# Patient Record
Sex: Female | Born: 1975 | Race: White | Hispanic: No | Marital: Single | Smoking: Never smoker
Health system: Southern US, Community
[De-identification: ages and names within clinical notes are randomized; demographics above are authoritative.]

## PROBLEM LIST (undated history)

## (undated) HISTORY — PX: BREAST SURGERY: SHX581

## (undated) HISTORY — PX: APPENDECTOMY: SHX54

## (undated) HISTORY — PX: ABDOMINAL HYSTERECTOMY: SHX81

## (undated) HISTORY — PX: TONSILLECTOMY: SUR1361

---

## 2017-12-01 ENCOUNTER — Encounter (HOSPITAL_BASED_OUTPATIENT_CLINIC_OR_DEPARTMENT_OTHER): Payer: Self-pay | Admitting: Emergency Medicine

## 2017-12-01 ENCOUNTER — Emergency Department (HOSPITAL_BASED_OUTPATIENT_CLINIC_OR_DEPARTMENT_OTHER): Payer: BLUE CROSS/BLUE SHIELD

## 2017-12-01 ENCOUNTER — Emergency Department (HOSPITAL_BASED_OUTPATIENT_CLINIC_OR_DEPARTMENT_OTHER)
Admission: EM | Admit: 2017-12-01 | Discharge: 2017-12-01 | Disposition: A | Discharge status: Home / Self Care | Payer: BLUE CROSS/BLUE SHIELD | Attending: Emergency Medicine | Admitting: Emergency Medicine

## 2017-12-01 ENCOUNTER — Other Ambulatory Visit: Payer: Self-pay

## 2017-12-01 DIAGNOSIS — R091 Pleurisy: Secondary | ICD-10-CM | POA: Insufficient documentation

## 2017-12-01 DIAGNOSIS — R079 Chest pain, unspecified: Secondary | ICD-10-CM | POA: Diagnosis present

## 2017-12-01 LAB — CBC
HCT: 42.8 % (ref 36.0–46.0)
HEMOGLOBIN: 13.9 g/dL (ref 12.0–15.0)
MCH: 28.8 pg (ref 26.0–34.0)
MCHC: 32.5 g/dL (ref 30.0–36.0)
MCV: 88.6 fL (ref 80.0–100.0)
NRBC: 0 % (ref 0.0–0.2)
Platelets: 229 10*3/uL (ref 150–400)
RBC: 4.83 MIL/uL (ref 3.87–5.11)
RDW: 12.9 % (ref 11.5–15.5)
WBC: 6.5 10*3/uL (ref 4.0–10.5)

## 2017-12-01 LAB — BASIC METABOLIC PANEL
ANION GAP: 8 (ref 5–15)
BUN: 11 mg/dL (ref 6–20)
CO2: 22 mmol/L (ref 22–32)
Calcium: 9.3 mg/dL (ref 8.9–10.3)
Chloride: 107 mmol/L (ref 98–111)
Creatinine, Ser: 0.64 mg/dL (ref 0.44–1.00)
GFR calc non Af Amer: >60 mL/min (ref >60)
GLUCOSE: 87 mg/dL (ref 70–99)
POTASSIUM: 4.5 mmol/L (ref 3.5–5.1)
SODIUM: 137 mmol/L (ref 135–145)

## 2017-12-01 LAB — TROPONIN I: Troponin I: <0.03 ng/mL (ref <0.03)

## 2017-12-01 LAB — PREGNANCY, URINE: PREG TEST UR: NEGATIVE

## 2017-12-01 LAB — D-DIMER, QUANTITATIVE (NOT AT ARMC): D DIMER QUANT: 0.29 ug{FEU}/mL (ref 0.00–0.50)

## 2017-12-01 MED ORDER — TRAMADOL HCL 50 MG PO TABS: ORAL_TABLET | ORAL | 0 refills | Status: AC

## 2017-12-01 MED ORDER — KETOROLAC TROMETHAMINE 30 MG/ML IJ SOLN
30.0000 mg | Freq: Once | INTRAMUSCULAR | Status: AC
  Administered 2017-12-01: 30 mg via INTRAVENOUS
  Filled 2017-12-01: qty 1

## 2017-12-01 MED ORDER — ORPHENADRINE CITRATE ER 100 MG PO TB12: 100.0000 mg | ORAL_TABLET | Freq: Two times a day (BID) | ORAL | 0 refills | Status: AC

## 2017-12-01 MED ORDER — FAMOTIDINE IN NACL 20-0.9 MG/50ML-% IV SOLN
20.0000 mg | Freq: Once | INTRAVENOUS | Status: AC
  Administered 2017-12-01: 20 mg via INTRAVENOUS
  Filled 2017-12-01: qty 50

## 2017-12-01 MED ORDER — ACETAMINOPHEN 500 MG PO TABS: 1000.0000 mg | ORAL_TABLET | Freq: Four times a day (QID) | ORAL | 0 refills | Status: AC | PRN

## 2017-12-01 MED ORDER — FAMOTIDINE 20 MG PO TABS: 20.0000 mg | ORAL_TABLET | Freq: Two times a day (BID) | ORAL | 0 refills | Status: AC

## 2017-12-01 NOTE — ED Notes (Signed)
Patient transported to X-ray 

## 2017-12-01 NOTE — ED Triage Notes (Signed)
L side chest tightness since early this morning. She states it radiates down her L arm and into her back and is worse with inspiration.

## 2017-12-01 NOTE — Discharge Instructions (Signed)
1.  Due to the possibility of gastritis or reflux contributing to your pain.  Avoid all medications called NSAIDs for the next several weeks.  This includes Voltaren, Motrin, ibuprofen, naproxen, Aleve, meloxicam.  Take Pepcid twice daily for the next 2 weeks. 2.  Take acetaminophen and Norflex (muscle relaxer) for pain for the next several days.  If pain is improving may need to decrease to as needed doses. 3.  If you need additional pain control you may add tramadol 1 to 2 tablets every 6 hours to the acetaminophen and Norflex. 4.  See a family doctor for recheck within the next 3 to 5 days.  Use the referral number in your discharge instructions if you cannot be seen by your family doctor in [place]. 5.  Return to the emergency department if you develop worsening pain, shortness of breath, lightheadedness, fever, cough or coughing up blood or other concerning symptoms.

## 2017-12-01 NOTE — ED Provider Notes (Signed)
[facility] EMERGENCY DEPARTMENT Provider Note   CSN: [id number] Arrival date & time: 12/01/17  0865     History   Chief Complaint Chief Complaint  Patient presents with  . Chest Pain    HPI Leslie Peters is a 42 y.o. female.  HPI Patient is healthy without past medical history.  She however has been taking Voltaren recently due to a twisted ankle.  She had been in [place] for 5 days on vacation.  Reports she was taking the Voltaren daily because she was walking a lot and needed for her ankle.  Patient got home yesterday.  She reports this morning at about 3 AM she awakened with left-sided chest pain.  She reports that is painful to deep breath and goes into her back.  She reports she feels pain in her arm as well.  Pain is aching in quality.  She denies recent fever, cough or lower extremity swelling.  No history of PE.  Her flight to [place] was short.  Patient does not smoke.  No birth control.  She did get a flu shot yesterday in the left shoulder. History reviewed. No pertinent past medical history.  There are no active problems to display for this patient.   Past Surgical History:  Procedure Laterality Date  . ABDOMINAL HYSTERECTOMY    . APPENDECTOMY    . BREAST SURGERY    . TONSILLECTOMY       OB History   None      Home Medications    Prior to Admission medications   Medication Sig Start Date End Date Taking? Authorizing Provider  acetaminophen (TYLENOL) 500 MG tablet Take 2 tablets (1,000 mg total) by mouth every 6 (six) hours as needed. 12/01/17   Arby Barrette, MD  famotidine (PEPCID) 20 MG tablet Take 1 tablet (20 mg total) by mouth 2 (two) times daily. 12/01/17   Arby Barrette, MD  orphenadrine (NORFLEX) 100 MG tablet Take 1 tablet (100 mg total) by mouth 2 (two) times daily. 12/01/17   Arby Barrette, MD  traMADol (ULTRAM) 50 MG tablet 1 to 2 tablets every 6 hours as needed for pain. 12/01/17   Arby Barrette, MD    Family  History No family history on file.  Social History Social History   Tobacco Use  . Smoking status: Never Smoker  . Smokeless tobacco: Never Used  Substance Use Topics  . Alcohol use: Yes    Comment: daily  . Drug use: Never     Allergies   Patient has no known allergies.   Review of Systems Review of Systems 10 Systems reviewed and are negative for acute change except as noted in the HPI.  Physical Exam Updated Vital Signs BP 116/79   Pulse 64   Temp 98.3 F (36.8 C) (Oral)   Resp 16   Ht 5\' 3"  (1.6 m)   Wt 56.7 kg   SpO2 100%   BMI 22.14 kg/m   Physical Exam  Constitutional: She is oriented to person, place, and time. She appears well-developed and well-nourished. No distress.  HENT:  Head: Normocephalic and atraumatic.  Mouth/Throat: Oropharynx is clear and moist.  Eyes: EOM are normal.  Neck: Neck supple.  Cardiovascular: Normal rate, regular rhythm, normal heart sounds and intact distal pulses.  Pulmonary/Chest: Effort normal and breath sounds normal.  Has mildly reproducible pain to the sternocostal border on the left.  No significant pain to palpation of the thoracic back or lower chest.  No rashes.  Soft tissue changes.  Abdominal: Soft. She exhibits no distension and no mass. There is no tenderness. There is no guarding.  Musculoskeletal: Normal range of motion. She exhibits no edema or tenderness.  No swelling of the upper extremities.  Left arm is normal without any soft tissue swelling or erythema.  Full range of motion.  Warm and dry.  Bilateral lower extremities calves soft and nontender.  No peripheral edema.  Feet warm and dry.  Lymphadenopathy:    She has no cervical adenopathy.  Neurological: She is alert and oriented to person, place, and time. No cranial nerve deficit. She exhibits normal muscle tone. Coordination normal.  Skin: Skin is warm and dry.  Psychiatric: She has a normal mood and affect.     ED Treatments / Results  Labs (all  labs ordered are listed, but only abnormal results are displayed) Labs Reviewed  BASIC METABOLIC PANEL  CBC  TROPONIN I  PREGNANCY, URINE  D-DIMER, QUANTITATIVE (NOT AT [facility])    EKG EKG Interpretation  Date/Time:  Saturday December 01 2017 09:55:48 EDT Ventricular Rate:  75 PR Interval:    QRS Duration: 81 QT Interval:  364 QTC Calculation: 407 R Axis:   70 Text Interpretation:  Sinus rhythm Low voltage, precordial leads normal, no old comparison Confirmed by Arby Barrette [id number]) on 12/01/2017 10:00:24 AM Also confirmed by Arby Barrette [phone]), editor Sheppard Evens ([id number])  on 12/01/2017 10:40:56 AM   Radiology Dg Chest 2 View  Result Date: 12/01/2017 CLINICAL DATA:  Anterior left sided chest pain beginning today. EXAM: CHEST - 2 VIEW COMPARISON:  None. FINDINGS: The heart size and mediastinal contours are within normal limits. Both lungs are clear. The visualized skeletal structures are unremarkable. IMPRESSION: Negative two view chest x-ray Electronically Signed   By: Marin Roberts M.D.   On: 12/01/2017 10:27    Procedures Procedures (including critical care time)  Medications Ordered in ED Medications  famotidine (PEPCID) IVPB 20 mg premix (20 mg Intravenous New Bag/Given 12/01/17 1123)  ketorolac (TORADOL) 30 MG/ML injection 30 mg (30 mg Intravenous Given 12/01/17 1122)     Initial Impression / Assessment and Plan / ED Course  I have reviewed the triage vital signs and the nursing notes.  Pertinent labs & imaging results that were available during my care of the patient were reviewed by me and considered in my medical decision making (see chart for details).    Patient presents with pain as outlined above.  Differential diagnosis includes PE, gastritis from NSAID use, musculoskeletal pain, localized muscle skeletal pain due to recent influenza shot, pericarditis.  Patient is very low risk for PE.  She does not have risk factors other than recent trip to  [place] which is a short plane ride.  She is otherwise very active and physically conditioned and does much physical activity.  D-dimer not elevated.  Low risk for PE.  Patient has been taking some increased frequency of Voltaren for a sprained ankle over the past several days.  Gastritis is a consideration.  Patient will hold NSAIDs for the next 2 weeks and take Pepcid twice daily.  Also potential for musculoskeletal pain.  Patient does workout although she reports she has been doing less so recently due to her recent vacation.  She however may have also done lifting of baggage etc. that she did not note to be a meaningful strain at that time.  Patient has had influenza shot yesterday.  She shows no signs of systemic reaction in  terms of coryzal symptoms, generalized myalgia, fever however consideration is for musculoskeletal pain associated with injection.  Patient is clinically very well in appearance.  She has no comorbid medical history.  At this time, plan will be for Pepcid twice daily for 2 weeks, acetaminophen and Norflex for the next several days and then as needed with tramadol to take if additional pain control required.  Return precautions reviewed.  Patient is instructed to follow-up with her PCP who is in [place].  She may establish a care provider here if that is more convenient for her.  Final Clinical Impressions(s) / ED Diagnoses   Final diagnoses:  Pleurisy    ED Discharge Orders         Ordered    famotidine (PEPCID) 20 MG tablet  2 times daily     12/01/17 1208    traMADol (ULTRAM) 50 MG tablet     12/01/17 1208    acetaminophen (TYLENOL) 500 MG tablet  Every 6 hours PRN     12/01/17 1208    orphenadrine (NORFLEX) 100 MG tablet  2 times daily     12/01/17 1208           Arby Barrette, MD 12/01/17 1219

## 2020-03-06 IMAGING — CR DG CHEST 2V
2 series · 2 of 2 positions shown · non-contrast
Comparison: None.

CLINICAL DATA: Anterior left sided chest pain beginning today.

EXAM:
CHEST - 2 VIEW

[w chest pa]
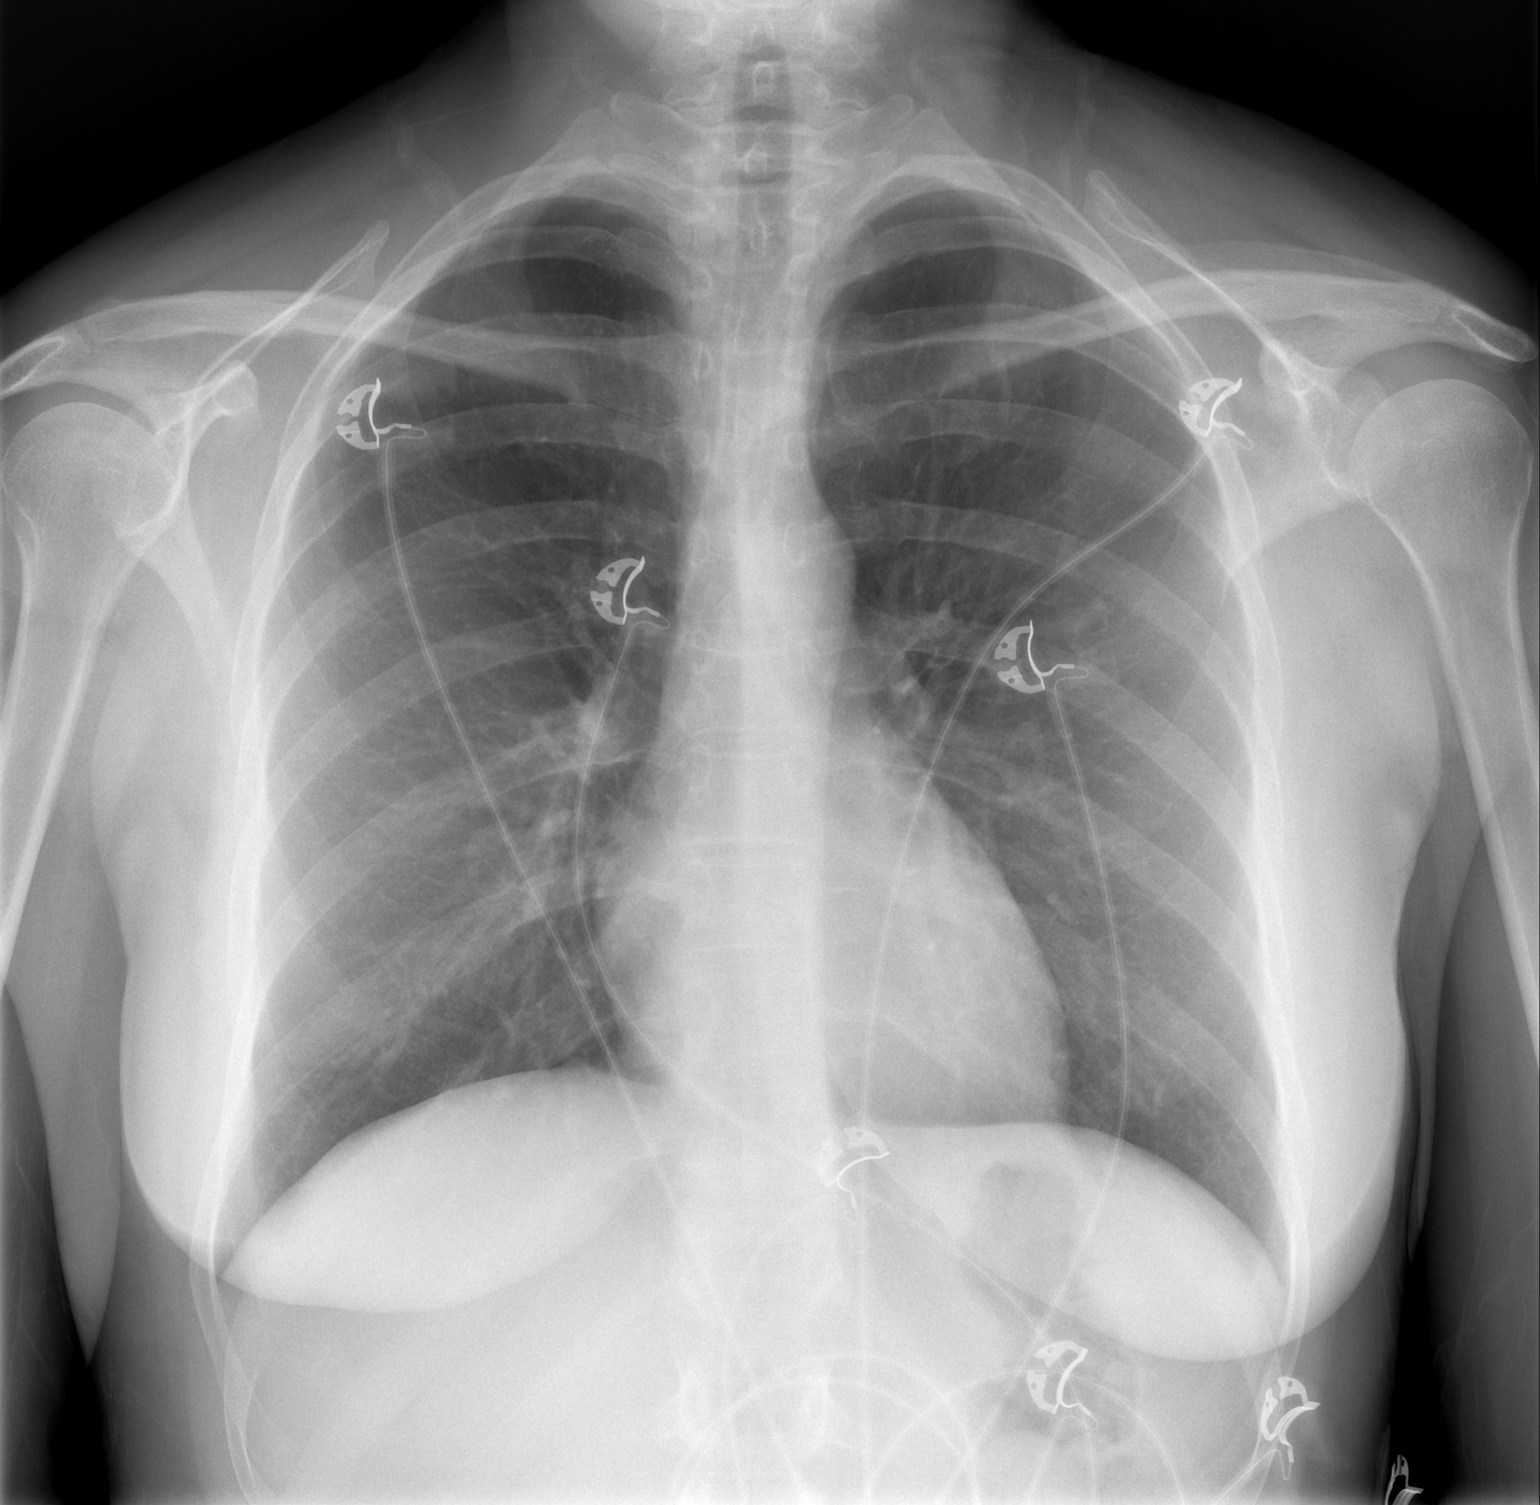

[w chest lat]
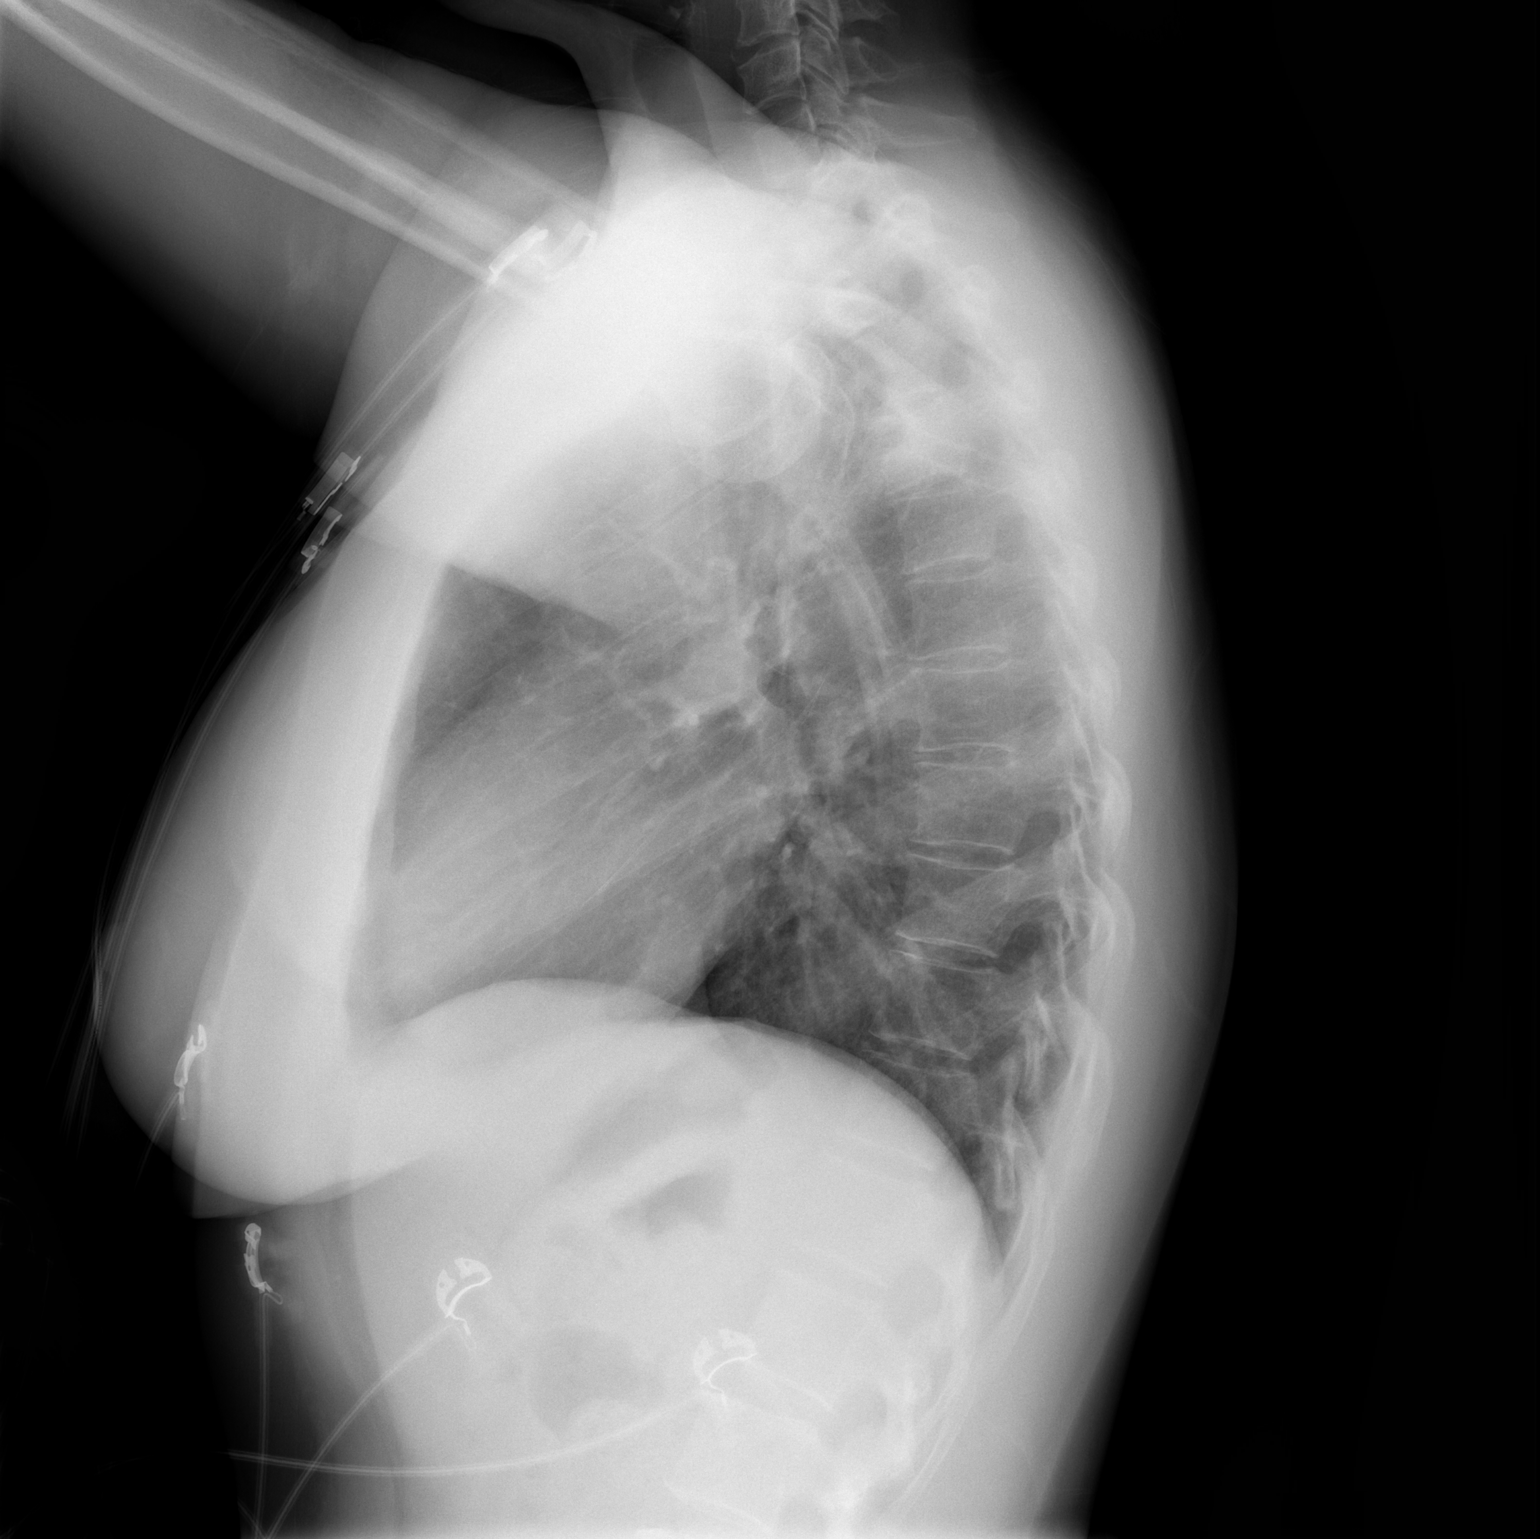

[2 of 2 positions shown; findings below may reference images not displayed]

FINDINGS: The heart size and mediastinal contours are within normal limits.
Both lungs are clear. The visualized skeletal structures are
unremarkable.
IMPRESSION: Negative two view chest x-ray
